# Patient Record
Sex: Female | Born: 2005 | Race: Black or African American | Hispanic: No | Marital: Single
Health system: Southern US, Community
[De-identification: ages and names within clinical notes are randomized; demographics above are authoritative.]

## PROBLEM LIST (undated history)

## (undated) DIAGNOSIS — D649 Anemia, unspecified: Secondary | ICD-10-CM

## (undated) DIAGNOSIS — K219 Gastro-esophageal reflux disease without esophagitis: Secondary | ICD-10-CM

## (undated) HISTORY — PX: NO PAST SURGERIES: SHX2092

---
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
## 2024-02-15 NOTE — Telephone Encounter (Signed)
 Error
